# Patient Record
Sex: Female | Born: 1990 | Race: Black or African American | Hispanic: No | Marital: Single | Smoking: Current every day smoker
Health system: Southern US, Community
[De-identification: ages and names within clinical notes are randomized; demographics above are authoritative.]

---

## 2018-09-16 ENCOUNTER — Emergency Department (HOSPITAL_COMMUNITY): Payer: Self-pay

## 2018-09-16 ENCOUNTER — Encounter (HOSPITAL_COMMUNITY): Payer: Self-pay

## 2018-09-16 ENCOUNTER — Inpatient Hospital Stay (HOSPITAL_COMMUNITY)
Admission: EM | Admit: 2018-09-16 | Discharge: 2018-09-18 | DRG: 604 | Disposition: A | Discharge status: Home / Self Care | Payer: Self-pay | Attending: Neurosurgery | Admitting: Neurosurgery

## 2018-09-16 ENCOUNTER — Other Ambulatory Visit: Payer: Self-pay

## 2018-09-16 DIAGNOSIS — S40212A Abrasion of left shoulder, initial encounter: Secondary | ICD-10-CM | POA: Diagnosis present

## 2018-09-16 DIAGNOSIS — S20412A Abrasion of left back wall of thorax, initial encounter: Secondary | ICD-10-CM | POA: Diagnosis present

## 2018-09-16 DIAGNOSIS — Z20828 Contact with and (suspected) exposure to other viral communicable diseases: Secondary | ICD-10-CM | POA: Diagnosis present

## 2018-09-16 DIAGNOSIS — S50312A Abrasion of left elbow, initial encounter: Secondary | ICD-10-CM | POA: Diagnosis present

## 2018-09-16 DIAGNOSIS — F17210 Nicotine dependence, cigarettes, uncomplicated: Secondary | ICD-10-CM | POA: Diagnosis present

## 2018-09-16 DIAGNOSIS — S0219XA Other fracture of base of skull, initial encounter for closed fracture: Secondary | ICD-10-CM | POA: Diagnosis present

## 2018-09-16 DIAGNOSIS — Y929 Unspecified place or not applicable: Secondary | ICD-10-CM

## 2018-09-16 DIAGNOSIS — Z23 Encounter for immunization: Secondary | ICD-10-CM

## 2018-09-16 DIAGNOSIS — S40812A Abrasion of left upper arm, initial encounter: Secondary | ICD-10-CM | POA: Diagnosis present

## 2018-09-16 DIAGNOSIS — S0101XA Laceration without foreign body of scalp, initial encounter: Principal | ICD-10-CM | POA: Diagnosis present

## 2018-09-16 DIAGNOSIS — S064X0A Epidural hemorrhage without loss of consciousness, initial encounter: Secondary | ICD-10-CM | POA: Diagnosis present

## 2018-09-16 LAB — CBC
HCT: 39.8 % (ref 36.0–46.0)
Hemoglobin: 13 g/dL (ref 12.0–15.0)
MCH: 29 pg (ref 26.0–34.0)
MCHC: 32.7 g/dL (ref 30.0–36.0)
MCV: 88.6 fL (ref 80.0–100.0)
Platelets: 410 10*3/uL — ABNORMAL HIGH (ref 150–400)
RBC: 4.49 MIL/uL (ref 3.87–5.11)
RDW: 11.9 % (ref 11.5–15.5)
WBC: 12.1 10*3/uL — ABNORMAL HIGH (ref 4.0–10.5)
nRBC: 0 % (ref 0.0–0.2)

## 2018-09-16 LAB — COMPREHENSIVE METABOLIC PANEL
ALT: 10 U/L (ref 0–44)
AST: 23 U/L (ref 15–41)
Albumin: 4.3 g/dL (ref 3.5–5.0)
Alkaline Phosphatase: 37 U/L — ABNORMAL LOW (ref 38–126)
Anion gap: 12 (ref 5–15)
BUN: 7 mg/dL (ref 6–20)
CO2: 22 mmol/L (ref 22–32)
Calcium: 9.1 mg/dL (ref 8.9–10.3)
Chloride: 105 mmol/L (ref 98–111)
Creatinine, Ser: 0.91 mg/dL (ref 0.44–1.00)
GFR calc Af Amer: >60 mL/min (ref >60)
GFR calc non Af Amer: >60 mL/min (ref >60)
Glucose, Bld: 116 mg/dL — ABNORMAL HIGH (ref 70–99)
Potassium: 3.4 mmol/L — ABNORMAL LOW (ref 3.5–5.1)
Sodium: 139 mmol/L (ref 135–145)
Total Bilirubin: 0.6 mg/dL (ref 0.3–1.2)
Total Protein: 6.6 g/dL (ref 6.5–8.1)

## 2018-09-16 LAB — LACTIC ACID, PLASMA: Lactic Acid, Venous: 1.9 mmol/L (ref 0.5–1.9)

## 2018-09-16 LAB — PROTIME-INR
INR: 1 (ref 0.8–1.2)
Prothrombin Time: 13.5 seconds (ref 11.4–15.2)

## 2018-09-16 LAB — ETHANOL: Alcohol, Ethyl (B): <10 mg/dL (ref <10)

## 2018-09-16 LAB — HCG, QUANTITATIVE, PREGNANCY: hCG, Beta Chain, Quant, S: <1 m[IU]/mL (ref <5)

## 2018-09-16 MED ORDER — FENTANYL CITRATE (PF) 100 MCG/2ML IJ SOLN
25.0000 ug | Freq: Once | INTRAMUSCULAR | Status: AC
  Administered 2018-09-16: 25 ug via INTRAVENOUS
  Filled 2018-09-16: qty 2

## 2018-09-16 MED ORDER — CEFAZOLIN SODIUM-DEXTROSE 1-4 GM/50ML-% IV SOLN
1.0000 g | Freq: Once | INTRAVENOUS | Status: AC
  Administered 2018-09-16: 1 g via INTRAVENOUS
  Filled 2018-09-16: qty 50

## 2018-09-16 MED ORDER — SODIUM CHLORIDE 0.9% FLUSH
3.0000 mL | Freq: Two times a day (BID) | INTRAVENOUS | Status: DC
  Administered 2018-09-17 – 2018-09-18 (×2): 3 mL via INTRAVENOUS

## 2018-09-16 MED ORDER — FENTANYL CITRATE (PF) 100 MCG/2ML IJ SOLN
25.0000 ug | Freq: Once | INTRAMUSCULAR | Status: AC
  Administered 2018-09-17: 02:00:00 25 ug via INTRAVENOUS
  Filled 2018-09-16: qty 2

## 2018-09-16 MED ORDER — FLEET ENEMA 7-19 GM/118ML RE ENEM: 1.0000 | ENEMA | Freq: Once | RECTAL | Status: DC | PRN

## 2018-09-16 MED ORDER — ACETAMINOPHEN 325 MG PO TABS
650.0000 mg | ORAL_TABLET | Freq: Four times a day (QID) | ORAL | Status: DC | PRN
  Administered 2018-09-18: 650 mg via ORAL
  Filled 2018-09-16: qty 2

## 2018-09-16 MED ORDER — LABETALOL HCL 5 MG/ML IV SOLN: 5.0000 mg | INTRAVENOUS | Status: DC | PRN

## 2018-09-16 MED ORDER — BISACODYL 10 MG RE SUPP: 10.0000 mg | Freq: Every day | RECTAL | Status: DC | PRN

## 2018-09-16 MED ORDER — ONDANSETRON HCL 4 MG/2ML IJ SOLN
4.0000 mg | Freq: Once | INTRAMUSCULAR | Status: AC
  Administered 2018-09-16: 4 mg via INTRAVENOUS
  Filled 2018-09-16: qty 2

## 2018-09-16 MED ORDER — ONDANSETRON HCL 4 MG/2ML IJ SOLN
4.0000 mg | Freq: Four times a day (QID) | INTRAMUSCULAR | Status: DC | PRN
  Administered 2018-09-17 – 2018-09-18 (×5): 4 mg via INTRAVENOUS
  Filled 2018-09-16 (×6): qty 2

## 2018-09-16 MED ORDER — ACETAMINOPHEN 650 MG RE SUPP: 650.0000 mg | Freq: Four times a day (QID) | RECTAL | Status: DC | PRN

## 2018-09-16 MED ORDER — BACITRACIN ZINC 500 UNIT/GM EX OINT
TOPICAL_OINTMENT | Freq: Two times a day (BID) | CUTANEOUS | Status: DC
  Administered 2018-09-17: 1 via TOPICAL
  Administered 2018-09-18: 10:00:00 via TOPICAL
  Filled 2018-09-16: qty 28.4

## 2018-09-16 MED ORDER — DOCUSATE SODIUM 100 MG PO CAPS
100.0000 mg | ORAL_CAPSULE | Freq: Two times a day (BID) | ORAL | Status: DC
  Administered 2018-09-17 – 2018-09-18 (×2): 100 mg via ORAL
  Filled 2018-09-16 (×2): qty 1

## 2018-09-16 MED ORDER — SODIUM CHLORIDE 0.9 % IV SOLN
INTRAVENOUS | Status: DC
  Administered 2018-09-17: 02:00:00 via INTRAVENOUS

## 2018-09-16 MED ORDER — POLYETHYLENE GLYCOL 3350 17 G PO PACK: 17.0000 g | PACK | Freq: Every day | ORAL | Status: DC | PRN

## 2018-09-16 MED ORDER — TETANUS-DIPHTH-ACELL PERTUSSIS 5-2.5-18.5 LF-MCG/0.5 IM SUSP
0.5000 mL | Freq: Once | INTRAMUSCULAR | Status: AC
  Administered 2018-09-16: 23:00:00 0.5 mL via INTRAMUSCULAR
  Filled 2018-09-16: qty 0.5

## 2018-09-16 MED ORDER — HYDRALAZINE HCL 20 MG/ML IJ SOLN: 5.0000 mg | INTRAMUSCULAR | Status: DC | PRN

## 2018-09-16 MED ORDER — OXYCODONE HCL 5 MG PO TABS
5.0000 mg | ORAL_TABLET | ORAL | Status: DC | PRN
  Administered 2018-09-17 – 2018-09-18 (×4): 5 mg via ORAL
  Filled 2018-09-16 (×4): qty 1

## 2018-09-16 MED ORDER — ONDANSETRON HCL 4 MG PO TABS: 4.0000 mg | ORAL_TABLET | Freq: Four times a day (QID) | ORAL | Status: DC | PRN
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
## 2018-09-16 NOTE — ED Notes (Addendum)
Patient back from  X-ray 

## 2018-09-16 NOTE — ED Notes (Signed)
Stapler at bedside

## 2018-09-16 NOTE — ED Notes (Signed)
Holding fentanyl based on bp. Will continue to monitor/
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
## 2018-09-16 NOTE — ED Notes (Signed)
Pt vomited in waiting area x 1
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
## 2018-09-17 ENCOUNTER — Observation Stay (HOSPITAL_COMMUNITY): Payer: Self-pay

## 2018-09-17 DIAGNOSIS — S40812A Abrasion of left upper arm, initial encounter: Secondary | ICD-10-CM | POA: Diagnosis present

## 2018-09-17 DIAGNOSIS — S50312A Abrasion of left elbow, initial encounter: Secondary | ICD-10-CM | POA: Diagnosis present

## 2018-09-17 DIAGNOSIS — F17210 Nicotine dependence, cigarettes, uncomplicated: Secondary | ICD-10-CM | POA: Diagnosis present

## 2018-09-17 DIAGNOSIS — S0219XA Other fracture of base of skull, initial encounter for closed fracture: Secondary | ICD-10-CM | POA: Diagnosis present

## 2018-09-17 DIAGNOSIS — Z20828 Contact with and (suspected) exposure to other viral communicable diseases: Secondary | ICD-10-CM | POA: Diagnosis present

## 2018-09-17 DIAGNOSIS — S40212A Abrasion of left shoulder, initial encounter: Secondary | ICD-10-CM | POA: Diagnosis present

## 2018-09-17 DIAGNOSIS — S064X0A Epidural hemorrhage without loss of consciousness, initial encounter: Secondary | ICD-10-CM | POA: Diagnosis present

## 2018-09-17 DIAGNOSIS — Z23 Encounter for immunization: Secondary | ICD-10-CM | POA: Diagnosis not present

## 2018-09-17 DIAGNOSIS — S20412A Abrasion of left back wall of thorax, initial encounter: Secondary | ICD-10-CM | POA: Diagnosis present

## 2018-09-17 DIAGNOSIS — Y929 Unspecified place or not applicable: Secondary | ICD-10-CM | POA: Diagnosis not present

## 2018-09-17 DIAGNOSIS — S0101XA Laceration without foreign body of scalp, initial encounter: Secondary | ICD-10-CM | POA: Diagnosis present

## 2018-09-17 LAB — MRSA PCR SCREENING: MRSA by PCR: NEGATIVE

## 2018-09-17 LAB — URINALYSIS, ROUTINE W REFLEX MICROSCOPIC
Bilirubin Urine: NEGATIVE
Glucose, UA: NEGATIVE mg/dL
Hgb urine dipstick: NEGATIVE
Ketones, ur: 20 mg/dL — AB
Leukocytes,Ua: NEGATIVE
Nitrite: NEGATIVE
Protein, ur: NEGATIVE mg/dL
Specific Gravity, Urine: 1.021 (ref 1.005–1.030)
pH: 5 (ref 5.0–8.0)

## 2018-09-17 LAB — CDS SEROLOGY

## 2018-09-17 LAB — SARS CORONAVIRUS 2 (TAT 6-24 HRS): SARS Coronavirus 2: NEGATIVE

## 2018-09-17 MED ORDER — HYDROMORPHONE HCL 1 MG/ML IJ SOLN
0.5000 mg | INTRAMUSCULAR | Status: DC | PRN
  Administered 2018-09-17: 0.5 mg via INTRAVENOUS
  Administered 2018-09-17 – 2018-09-18 (×4): 1 mg via INTRAVENOUS
  Filled 2018-09-17 (×5): qty 1

## 2018-09-17 MED ORDER — CHLORHEXIDINE GLUCONATE CLOTH 2 % EX PADS
6.0000 | MEDICATED_PAD | Freq: Every day | CUTANEOUS | Status: DC
  Administered 2018-09-17: 6 via TOPICAL
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
## 2018-09-17 NOTE — Progress Notes (Signed)
Patient to 4N28 at 0630, A/Ox4 on arrival with full neuro assessment as charted. Patient belongings at bedside: one cell phone, one pair of shoes, one shirt, one pair of pants, one pair of socks, one pair of underwear, one pair of earrings, and rings.   Candy Sledge, RN

## 2018-09-17 NOTE — Progress Notes (Signed)
PT Cancellation Note  Patient Details Name: Sachi Boulay MRN: [id number] DOB: [birth year]   Cancelled Treatment:    Reason Eval/Treat Not Completed: Other (comment) Pt sleeping, recently given Dilaudid due to severe headache.  Ellamae Sia, PT, DPT Acute Rehabilitation Services Pager [phone] Office [phone]    Willy Eddy 09/17/2018, 2:30 PM

## 2018-09-18 ENCOUNTER — Inpatient Hospital Stay (HOSPITAL_COMMUNITY): Payer: Self-pay

## 2018-09-18 MED ORDER — OXYCODONE HCL 5 MG PO TABS: 5.0000 mg | ORAL_TABLET | ORAL | 0 refills | Status: AC | PRN
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
## 2018-09-18 NOTE — Progress Notes (Signed)
Overall stable.  Moderate headache.  No new neurologic problems.  Awake and alert.  Oriented and appropriate.  Cranial nerve function intact.  Motor and sensory function extremities normal.  Status post small left posterior temporal fracture with small associated epidural.  Continue observation.  Begin to mobilize.
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
## 2018-09-18 NOTE — Progress Notes (Signed)
Discharge AVS went over with patient and significant other. All questions answered. Patient transported to discharge lobby with via RN by wheelchair.
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
